# Patient Record
Sex: Female | Born: 2005 | Race: Black or African American | Hispanic: No | Marital: Single
Health system: Southern US, Community
[De-identification: ages and names within clinical notes are randomized; demographics above are authoritative.]

---
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
## 2023-01-29 ENCOUNTER — Other Ambulatory Visit: Payer: Self-pay

## 2023-01-29 ENCOUNTER — Emergency Department (HOSPITAL_BASED_OUTPATIENT_CLINIC_OR_DEPARTMENT_OTHER)
Admission: EM | Admit: 2023-01-29 | Discharge: 2023-01-29 | Disposition: A | Discharge status: Home / Self Care | Payer: Medicaid Other | Attending: Emergency Medicine | Admitting: Emergency Medicine

## 2023-01-29 ENCOUNTER — Emergency Department (HOSPITAL_BASED_OUTPATIENT_CLINIC_OR_DEPARTMENT_OTHER): Payer: Medicaid Other

## 2023-01-29 DIAGNOSIS — K2100 Gastro-esophageal reflux disease with esophagitis, without bleeding: Secondary | ICD-10-CM | POA: Insufficient documentation

## 2023-01-29 DIAGNOSIS — R079 Chest pain, unspecified: Secondary | ICD-10-CM | POA: Diagnosis present

## 2023-01-29 MED ORDER — ALUM & MAG HYDROXIDE-SIMETH 200-200-20 MG/5ML PO SUSP
30.0000 mL | Freq: Once | ORAL | Status: AC
  Administered 2023-01-29: 30 mL via ORAL
  Filled 2023-01-29: qty 30

## 2023-01-29 MED ORDER — PANTOPRAZOLE SODIUM 20 MG PO TBEC: 20.0000 mg | DELAYED_RELEASE_TABLET | Freq: Every day | ORAL | 0 refills | Status: AC

## 2023-01-29 MED ORDER — LIDOCAINE VISCOUS HCL 2 % MT SOLN
15.0000 mL | Freq: Once | OROMUCOSAL | Status: AC
  Administered 2023-01-29: 15 mL via ORAL
  Filled 2023-01-29: qty 15

## 2023-01-29 NOTE — ED Provider Notes (Signed)
EMERGENCY DEPARTMENT AT [facility] Provider Note   CSN: [id number] Arrival date & time: 01/29/23  1922     History  Chief Complaint  Patient presents with   Dysphagia   Sore Throat    Carolyn Armstrong is a 17 y.o. female, no pertinent past medical history, who presents to the ED secondary to chest pain when swallowing, and sensation of foreign body in her throat.  She states that every time she swallows she has pain in her throat that radiates to her chest, is worse when she takes a deep breath.  Denies any kind of sour taste in the mouth, sore throat, recent cough or illness.  Is able to eat and drink, but states it is more painful when she does this.  Has no difficulty swallowing water, but only when she eats something like pizza it makes it worse.  S    Home Medications Prior to Admission medications   Medication Sig Start Date End Date Taking? Authorizing Provider  pantoprazole (PROTONIX) 20 MG tablet Take 1 tablet (20 mg total) by mouth daily. 01/29/23  Yes Rocquel Askren L, PA      Allergies    Patient has no known allergies.    Review of Systems   Review of Systems  Respiratory:  Negative for cough and shortness of breath.   Cardiovascular:  Positive for chest pain.    Physical Exam Updated Vital Signs BP 115/72 (BP Location: Right Arm)   Pulse 77   Temp 98.9 F (37.2 C) (Oral)   Resp 16   Ht 5\' 4"  (1.626 m)   Wt 59.9 kg   LMP 01/07/2023   SpO2 99%   BMI 22.66 kg/m  Physical Exam Vitals and nursing note reviewed.  Constitutional:      General: She is not in acute distress.    Appearance: She is well-developed.  HENT:     Head: Normocephalic and atraumatic.  Eyes:     Conjunctiva/sclera: Conjunctivae normal.  Neck:     Comments: No crepitus Cardiovascular:     Rate and Rhythm: Normal rate and regular rhythm.     Heart sounds: No murmur heard. Pulmonary:     Effort: Pulmonary effort is normal. No respiratory distress.     Breath  sounds: Normal breath sounds.  Chest:     Comments: No crepitus or pulsatile masses.  No tenderness to palpation. Abdominal:     Palpations: Abdomen is soft.     Tenderness: There is no abdominal tenderness.  Musculoskeletal:        General: No swelling.     Cervical back: Neck supple.  Skin:    General: Skin is warm and dry.     Capillary Refill: Capillary refill takes less than 2 seconds.  Neurological:     Mental Status: She is alert.  Psychiatric:        Mood and Affect: Mood normal.     ED Results / Procedures / Treatments   Labs (all labs ordered are listed, but only abnormal results are displayed) Labs Reviewed - No data to display  EKG None  Radiology DG Neck Soft Tissue  Result Date: 01/29/2023 CLINICAL DATA:  Difficulty swallowing EXAM: NECK SOFT TISSUES - 1+ VIEW COMPARISON:  None Available. FINDINGS: The epiglottis and aryepiglottic folds are within normal limits. No prevertebral soft tissue changes are seen. Trachea is well distended. No tracheal deviation is seen. No foreign body is noted. IMPRESSION: No acute abnormality noted. Electronically Signed  By: Alcide Clever M.D.   On: 01/29/2023 21:08   DG Chest 2 View  Result Date: 01/29/2023 CLINICAL DATA:  Difficulty swallowing EXAM: CHEST - 2 VIEW COMPARISON:  None Available. FINDINGS: The heart size and mediastinal contours are within normal limits. Both lungs are clear. The visualized skeletal structures are unremarkable. IMPRESSION: No active cardiopulmonary disease. Electronically Signed   By: Alcide Clever M.D.   On: 01/29/2023 21:08    Procedures Procedures    Medications Ordered in ED Medications  alum & mag hydroxide-simeth (MAALOX/MYLANTA) 200-200-20 MG/5ML suspension 30 mL (30 mLs Oral Given 01/29/23 2128)    And  lidocaine (XYLOCAINE) 2 % viscous mouth solution 15 mL (15 mLs Oral Given 01/29/23 2128)    ED Course/ Medical Decision Making/ A&P                             Medical Decision  Making Patient is a well-appearing 17 year old female, here for chest pain, esophageal pain that occurs it when she eats or drinks.  It is worse when she eats things like pizza.  Will obtain a chest x-ray, soft tissue neck, to evaluate for any signs of free air.  There appears to be no crepitus on exam.  As well as get an EKG given her chest pain.  She is PERC negative thus dimer was not obtained.  We will also give her a cocktail, to help with her possible acid reflux.  Amount and/or Complexity of Data Reviewed Radiology: ordered.    Details: Chest x-ray soft tissue x-ray shows no findings of any kind of free air ECG/medicine tests:     Details: Normal sinus rhythm Discussion of management or test interpretation with external provider(s): Discussed with patient she is feeling much better after the Maalox, lidocaine drink, pain has now resolved.  She is feeling much better, no free air on x-rays, and the EKG shows normal sinus rhythm.  Suspicious that she has findings for GERD, versus gastritis, we will put her on pantoprazole outpatient, have her follow-up with her primary care doctor.  Discharged home with strict return precautions.  Risk OTC drugs. Prescription drug management.   Final Clinical Impression(s) / ED Diagnoses Final diagnoses:  Gastroesophageal reflux disease with esophagitis, unspecified whether hemorrhage    Rx / DC Orders ED Discharge Orders          Ordered    pantoprazole (PROTONIX) 20 MG tablet  Daily        01/29/23 2153              Pete Pelt, [place] 01/29/23 2156    Benjiman Core, MD 01/29/23 2247

## 2023-01-29 NOTE — ED Triage Notes (Signed)
POV from home, A&O x 4, amb to triage.  Pt c/o painful swallowing and burning when swallowing. Sts it was worse this am when she woke up but better throughout the day. Clear speech, controlling oral secretions.

## 2023-01-29 NOTE — Discharge Instructions (Addendum)
Please follow-up with your primary care doctor, use the daily to prevent any kind of acid reflux as well as make changes in your diet.  Please follow-up with your primary care doctor.  Return to the ER if you have worsening chest pain, shortness of breath, or unable to eat or drink.

## 2023-01-29 NOTE — ED Notes (Signed)
